# Patient Record
Sex: Female | Born: 1960 | Race: White | Hispanic: No | Smoking: Current every day smoker
Health system: Southern US, Community
[De-identification: ages and names within clinical notes are randomized; demographics above are authoritative.]

## PROBLEM LIST (undated history)

## (undated) HISTORY — PX: ABDOMINAL HYSTERECTOMY: SHX81

## (undated) HISTORY — PX: TONSILLECTOMY: SUR1361

## (undated) HISTORY — PX: SHOULDER SURGERY: SHX246

---

## 2004-05-10 ENCOUNTER — Emergency Department (HOSPITAL_COMMUNITY): Admission: EM | Admit: 2004-05-10 | Discharge: 2004-05-10 | Payer: Self-pay | Admitting: Emergency Medicine

## 2004-06-16 ENCOUNTER — Emergency Department (HOSPITAL_COMMUNITY): Admission: EM | Admit: 2004-06-16 | Discharge: 2004-06-16 | Payer: Self-pay

## 2004-06-19 ENCOUNTER — Ambulatory Visit (HOSPITAL_COMMUNITY): Admission: RE | Admit: 2004-06-19 | Discharge: 2004-06-19 | Payer: Self-pay | Admitting: Obstetrics and Gynecology

## 2004-07-08 ENCOUNTER — Observation Stay (HOSPITAL_COMMUNITY): Admission: RE | Admit: 2004-07-08 | Discharge: 2004-07-09 | Payer: Self-pay | Admitting: Obstetrics and Gynecology

## 2005-03-07 ENCOUNTER — Emergency Department (HOSPITAL_COMMUNITY): Admission: EM | Admit: 2005-03-07 | Discharge: 2005-03-07 | Payer: Self-pay | Admitting: Emergency Medicine

## 2011-07-13 ENCOUNTER — Emergency Department (INDEPENDENT_AMBULATORY_CARE_PROVIDER_SITE_OTHER)
Admission: EM | Admit: 2011-07-13 | Discharge: 2011-07-13 | Disposition: A | Discharge status: Home / Self Care | Payer: BC Managed Care – PPO | Source: Home / Self Care | Attending: Family Medicine | Admitting: Family Medicine

## 2011-07-13 ENCOUNTER — Encounter (HOSPITAL_COMMUNITY): Payer: Self-pay | Admitting: *Deleted

## 2011-07-13 ENCOUNTER — Emergency Department (INDEPENDENT_AMBULATORY_CARE_PROVIDER_SITE_OTHER): Payer: BC Managed Care – PPO

## 2011-07-13 DIAGNOSIS — M25559 Pain in unspecified hip: Secondary | ICD-10-CM

## 2011-07-13 DIAGNOSIS — M25552 Pain in left hip: Secondary | ICD-10-CM

## 2011-07-13 MED ORDER — HYDROCODONE-ACETAMINOPHEN 5-325 MG PO TABS: ORAL_TABLET | ORAL | Status: AC

## 2011-07-13 MED ORDER — NAPROXEN 375 MG PO TABS: 375.0000 mg | ORAL_TABLET | Freq: Two times a day (BID) | ORAL | Status: AC

## 2011-07-13 NOTE — ED Notes (Signed)
Pt reports 5 years of  chronic left hip pain.    It is worse today with numbness of the entire left leg and foot.  She thinks she has been  Bending  and squatting  more lately than usual.  She ambulated in  The exam room with a limp
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
## 2011-07-16 ENCOUNTER — Telehealth (HOSPITAL_COMMUNITY): Payer: Self-pay | Admitting: *Deleted

## 2011-07-16 NOTE — ED Notes (Addendum)
2/7 1647  Pt. called on VM same message as previously.Melissa Haley 07/16/2011

## 2011-07-16 NOTE — ED Notes (Signed)
2/8 Pt. called before 1130 and left a message with Adam for me to call. Melissa Haley 07/16/2011

## 2011-07-16 NOTE — ED Notes (Signed)
1244 I called pt. back and told her we could only refer to the orthopedist on call. If she has an orthopedist she has seen before she can follow up with them. Pt. states she went ahead and scheduled with [facility] 1/25 @ 10:45. Vassie Moselle 07/16/2011

## 2011-07-16 NOTE — ED Notes (Signed)
Pt. Called on VM and said we referred her to and orthopedist in [place] and she wants one in [place]. Vassie Moselle 07/16/2011

## 2011-07-22 ENCOUNTER — Encounter (HOSPITAL_COMMUNITY): Payer: Self-pay | Admitting: *Deleted

## 2011-07-22 ENCOUNTER — Emergency Department (INDEPENDENT_AMBULATORY_CARE_PROVIDER_SITE_OTHER)
Admission: EM | Admit: 2011-07-22 | Discharge: 2011-07-22 | Disposition: A | Discharge status: Home / Self Care | Payer: BC Managed Care – PPO | Source: Home / Self Care | Attending: Family Medicine | Admitting: Family Medicine

## 2011-07-22 DIAGNOSIS — M25559 Pain in unspecified hip: Secondary | ICD-10-CM

## 2011-07-22 MED ORDER — HYDROCODONE-ACETAMINOPHEN 5-325 MG PO TABS: ORAL_TABLET | ORAL | Status: AC

## 2011-07-22 NOTE — ED Provider Notes (Signed)
History     CSN: [id number]  Arrival date & time 07/22/11  1236   First MD Initiated Contact with Patient 07/22/11 1241      Chief Complaint  Patient presents with  . Leg Pain  . Follow-up    (Consider location/radiation/quality/duration/timing/severity/associated sxs/prior treatment) HPI Comments: Naimah presents for followup evaluation of persistent left hip pain. She was seen here several weeks ago by this provider. X-rays at that time were negative. She was referred to a local orthopedic practice. She has not yet seen the orthopedic provider has an appointment on February 25. She reports that she has not been back to work since her visit. She is asking for short-term disability. She does request a refill of pain medication. Which she states helps. She does report persistent numbness in her leg. She denies any weakness or falls. Her job entails her to stand all day long standing process. However, she does report a 5 year chronic history of this pain.  Patient is a 51 y.o. female presenting with hip pain. The history is provided by the patient.  Hip Pain This is a chronic problem. The current episode started more than 1 week ago. The problem occurs constantly. The problem has been gradually worsening. The symptoms are aggravated by exertion, standing and walking. The symptoms are relieved by narcotics.    History reviewed. No pertinent past medical history.  Past Surgical History  Procedure Date  . Abdominal hysterectomy   . Shoulder surgery     Family History  Problem Relation Age of Onset  . Multiple sclerosis Maternal Aunt     History  Substance Use Topics  . Smoking status: Current Everyday Smoker -- 0.5 packs/day for 10 years    Types: Cigarettes  . Smokeless tobacco: Not on file  . Alcohol Use: Yes     occasionally     OB History    Grav Para Term Preterm Abortions TAB SAB Ect Mult Living                  Review of Systems  Constitutional: Negative.     HENT: Negative.   Eyes: Negative.   Respiratory: Negative.   Cardiovascular: Negative.   Gastrointestinal: Negative.   Genitourinary: Negative.   Musculoskeletal: Positive for arthralgias and gait problem.  Skin: Negative.   Neurological: Negative.     Allergies  Review of patient's allergies indicates no known allergies.  Home Medications   Current Outpatient Rx  Name Route Sig Dispense Refill  . HYDROCODONE-ACETAMINOPHEN 5-325 MG PO TABS  Take one to two tablets every 4 to 6 hours as needed for pain 15 tablet 0  . NAPROXEN 375 MG PO TABS Oral Take 1 tablet (375 mg total) by mouth 2 (two) times daily. 20 tablet 0  . HYDROCODONE-ACETAMINOPHEN 5-325 MG PO TABS  Take one to two tablets every 4 to 6 hours as needed for pain 30 tablet 0    BP 130/77  Pulse 82  Temp(Src) 97.9 F (36.6 C) (Oral)  Resp 16  SpO2 98%  Physical Exam  Nursing note and vitals reviewed. Constitutional: She is oriented to person, place, and time. She appears well-developed and well-nourished.  HENT:  Head: Normocephalic and atraumatic.  Right Ear: Tympanic membrane normal.  Left Ear: Tympanic membrane normal.  Mouth/Throat: Uvula is midline and mucous membranes are normal.  Eyes: EOM are normal.  Neck: Normal range of motion.  Pulmonary/Chest: Effort normal.  Musculoskeletal: Normal range of motion.  Neurological: She is alert  and oriented to person, place, and time.  Skin: Skin is warm and dry.  Psychiatric: Her behavior is normal.    ED Course  Procedures (including critical care time)  Labs Reviewed - No data to display No results found.   1. Hip pain       MDM  Refilled hydrocodone; explained that Orthopaedic provider should be able to complete disability papers based on assessment and diagnosis; given written excuse from work until "cleared by specialty physician."         Richardo Priest, MD 07/22/11 1329

## 2011-07-22 NOTE — ED Notes (Signed)
Pt here for f/u of left groin, upper leg pain.  Dr. Juanetta Gosling has already been in and examined patient.

## 2011-07-22 NOTE — Discharge Instructions (Signed)
Take medications as directed. Follow up with the Orthopaedic provider as scheduled.

## 2015-08-23 ENCOUNTER — Encounter: Payer: Self-pay | Admitting: Emergency Medicine

## 2015-08-23 ENCOUNTER — Emergency Department
Admission: EM | Admit: 2015-08-23 | Discharge: 2015-08-23 | Discharge status: Against Medical Advice | Payer: BLUE CROSS/BLUE SHIELD | Source: Home / Self Care

## 2015-08-23 NOTE — ED Notes (Signed)
Patient here with reports of sinus issues; cough, hoarseness, headache, ear pain. No recent OTCs.

## 2021-02-23 ENCOUNTER — Other Ambulatory Visit: Payer: Self-pay

## 2021-02-23 ENCOUNTER — Other Ambulatory Visit: Payer: Self-pay | Admitting: Occupational Medicine

## 2021-02-23 ENCOUNTER — Ambulatory Visit: Payer: Self-pay

## 2021-02-23 DIAGNOSIS — M25531 Pain in right wrist: Secondary | ICD-10-CM

## 2021-02-23 DIAGNOSIS — M79641 Pain in right hand: Secondary | ICD-10-CM
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
